# Patient Record
Sex: Female | Born: 1965 | Race: White | Hispanic: Yes | Marital: Married | Smoking: Never smoker
Health system: Southern US, Community
[De-identification: ages and names within clinical notes are randomized; demographics above are authoritative.]

## PROBLEM LIST (undated history)

## (undated) DIAGNOSIS — I10 Essential (primary) hypertension: Secondary | ICD-10-CM

## (undated) DIAGNOSIS — E119 Type 2 diabetes mellitus without complications: Secondary | ICD-10-CM

---

## 2019-10-01 ENCOUNTER — Other Ambulatory Visit: Payer: Self-pay

## 2019-10-01 ENCOUNTER — Encounter (HOSPITAL_COMMUNITY): Payer: Self-pay

## 2019-10-01 ENCOUNTER — Emergency Department (HOSPITAL_COMMUNITY)
Admission: EM | Admit: 2019-10-01 | Discharge: 2019-10-01 | Disposition: A | Discharge status: Home / Self Care | Payer: Self-pay | Attending: Emergency Medicine | Admitting: Emergency Medicine

## 2019-10-01 ENCOUNTER — Emergency Department (HOSPITAL_COMMUNITY): Payer: Self-pay

## 2019-10-01 DIAGNOSIS — Y929 Unspecified place or not applicable: Secondary | ICD-10-CM | POA: Insufficient documentation

## 2019-10-01 DIAGNOSIS — S2242XA Multiple fractures of ribs, left side, initial encounter for closed fracture: Secondary | ICD-10-CM | POA: Insufficient documentation

## 2019-10-01 DIAGNOSIS — I10 Essential (primary) hypertension: Secondary | ICD-10-CM | POA: Insufficient documentation

## 2019-10-01 DIAGNOSIS — W182XXA Fall in (into) shower or empty bathtub, initial encounter: Secondary | ICD-10-CM | POA: Insufficient documentation

## 2019-10-01 DIAGNOSIS — Y93E1 Activity, personal bathing and showering: Secondary | ICD-10-CM | POA: Insufficient documentation

## 2019-10-01 DIAGNOSIS — J45909 Unspecified asthma, uncomplicated: Secondary | ICD-10-CM | POA: Insufficient documentation

## 2019-10-01 DIAGNOSIS — Y999 Unspecified external cause status: Secondary | ICD-10-CM | POA: Insufficient documentation

## 2019-10-01 DIAGNOSIS — W19XXXA Unspecified fall, initial encounter: Secondary | ICD-10-CM

## 2019-10-01 HISTORY — DX: Essential (primary) hypertension: I10

## 2019-10-01 HISTORY — DX: Type 2 diabetes mellitus without complications: E11.9

## 2019-10-01 MED ORDER — HYDROCODONE-ACETAMINOPHEN 5-325 MG PO TABS: 2.0000 | ORAL_TABLET | ORAL | 0 refills | Status: DC | PRN

## 2019-10-01 NOTE — Discharge Instructions (Addendum)
Please use Tylenol or ibuprofen for pain.  You may use 400 mg ibuprofen every 6 hours or 1000 mg of Tylenol every 6 hours.  You may choose to alternate between the 2.  This would be most effective.  Not to exceed 4 g of Tylenol within 24 hours.  Not to exceed 3200 mg ibuprofen 24 hours.   Please use norco only as needed for pain.  This medication can make you drowsy.  Please do not mix with alcohol.

## 2019-10-01 NOTE — ED Triage Notes (Signed)
Pt arrives POV for eval of L sided rib and back pain s/p fall. Pt reports that she slipped and fell this AM while in bathroom, ambulatory but reports severe pain. Pt appears uncomfortable in triage.

## 2019-10-01 NOTE — ED Provider Notes (Signed)
[facility] EMERGENCY DEPARTMENT Provider Note   CSN: [id number] Arrival date & time: 10/01/19  1052     History Chief Complaint  Patient presents with  . Fall    Kathryn Guerra is a 54 y.o. female. A Spanish language interpreter was used for the entirety of this visit including discharge instructions and recommendations for medications.  HPI Patient is a 54 year old female presented today with left-sided rib pain after falling on her shower today and hitting her left side on the side of the bathtub.  States that she had immediate onset of sharp, severe, achy left-sided rib cage pain that is worse with deep breaths.  States she took some naproxen and presented to ED immediately after.  States some improvement with naproxen.   Denies any lightheadedness or dizziness prior to fall states that she fell because she slipped.  Denies any lightheadedness, headache, dizziness after fall.  Denies any head injury.  Denies loss of consciousness.  Denies any nausea or vomiting.  Denies any chest pain or shortness of breath.  States that she is able to take deep breaths although it is uncomfortable.  Denies any other injuries.    Past Medical History:  Diagnosis Date  . Diabetes mellitus without complication (HCC)   . Hypertension     There are no problems to display for this patient.   History reviewed. No pertinent surgical history.   OB History   No obstetric history on file.     No family history on file.  Social History   Tobacco Use  . Smoking status: Never Smoker  . Smokeless tobacco: Never Used  Substance Use Topics  . Alcohol use: Not Currently  . Drug use: Not Currently    Home Medications Prior to Admission medications   Not on File    Allergies    Other  Review of Systems   Review of Systems  Constitutional: Negative for fever.  HENT: Negative for congestion.   Respiratory: Negative for shortness of breath.   Cardiovascular:  Negative for chest pain.  Gastrointestinal: Negative for abdominal distention.  Musculoskeletal:       Rib pain left side  Neurological: Negative for dizziness and headaches.  Hematological: Does not bruise/bleed easily.  Psychiatric/Behavioral: Negative for agitation.    Physical Exam Updated Vital Signs BP 118/72 (BP Location: Left Arm)   Pulse 68   Temp 97.6 F (36.4 C) (Oral)   Resp 18   Ht 4' 11.06" (1.5 m)   Wt 72.6 kg   SpO2 98%   BMI 32.26 kg/m   Physical Exam Vitals and nursing note reviewed.  Constitutional:      General: She is not in acute distress.    Appearance: She is not ill-appearing.  HENT:     Head: Normocephalic and atraumatic.     Nose: Nose normal.  Eyes:     General: No scleral icterus. Neck:     Comments: No midline neck tenderness Cardiovascular:     Rate and Rhythm: Normal rate and regular rhythm.     Pulses: Normal pulses.     Heart sounds: Normal heart sounds.  Pulmonary:     Effort: Pulmonary effort is normal. No respiratory distress.     Breath sounds: No wheezing.  Abdominal:     Palpations: Abdomen is soft.     Tenderness: There is no abdominal tenderness.     Comments: No abdominal TTP  Musculoskeletal:     Cervical back: Normal range of motion.  Right lower leg: No edema.     Left lower leg: No edema.     Comments: Tenderness to palpation over the ninth and 10th rib area of the left side just posterior to mid axillary line  No midline back tenderness F ROM of spine  Skin:    General: Skin is warm and dry.     Capillary Refill: Capillary refill takes less than 2 seconds.     Comments: No bruising of side, no tenting of skin over ribs  Neurological:     Mental Status: She is alert. Mental status is at baseline.     Sensory: No sensory deficit.     Motor: No weakness.     Gait: Gait normal.  Psychiatric:        Mood and Affect: Mood normal.        Behavior: Behavior normal.     ED Results / Procedures / Treatments     Labs (all labs ordered are listed, but only abnormal results are displayed) Labs Reviewed - No data to display  EKG None  Radiology DG Ribs Unilateral W/Chest Left  Result Date: 10/01/2019 CLINICAL DATA:  Fall.Left-sided rib and back pain EXAM: LEFT RIBS AND CHEST - 3+ VIEW COMPARISON:  None FINDINGS: There are age indeterminate fracture deformities involving the posterior aspect of the left ninth and tenth ribs. Normal heart size. No pleural effusion or edema. No airspace opacities. IMPRESSION: 1. Age-indeterminate fracture deformities involving the posterior aspect of the left ninth and tenth ribs. 2. No acute cardiopulmonary abnormalities. Electronically Signed   By: Kerby Moors M.D.   On: 10/01/2019 12:34   DG Lumbar Spine Complete  Result Date: 10/01/2019 CLINICAL DATA:  Pain following fall EXAM: LUMBAR SPINE - COMPLETE 4+ VIEW COMPARISON:  None. FINDINGS: Frontal, lateral, spot lumbosacral lateral, and bilateral oblique views were obtained. There are 5 non-rib-bearing lumbar type vertebral bodies. There is no fracture or spondylolisthesis. There is disc space narrowing at L4-5 and L5-S1. Other disc spaces appear unremarkable. There is facet osteoarthritic change at L5-S1 bilaterally and at L4-5 on the left. 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 No fracture or spondylolisthesis. Electronically Signed   By: Lowella Grip III M.D.   On: 10/01/2019 12:30    Procedures Procedures (including critical care time)  Medications Ordered in ED Medications - No data to display  ED Course  I have reviewed the triage vital signs and the nursing notes.  Pertinent labs & imaging results that were available during my care of the patient were reviewed by me and considered in my medical decision making (see chart for details).    MDM Rules/Calculators/A&P                      Patient presents today after mechanical fall with no head injury or loss of consciousness with  left-sided rib cage pain found to have nondisplaced fractures of ninth and 10th rib.  There is no pneumothorax evident on chest x-ray and on auscultation she has clear lung sounds bilaterally.  She does have some pain with deep inspiration.  Will provide patient with pain medications recommendations in the form of Tylenol ibuprofen and short course of Norco to use as needed.   Long discussion with patient regarding use of incentive spirometer and return precautions if she is having difficulty breathing.  She is understanding of this and I will provide her with Spanish incentive spirometry instructions.  She has no midline back or neck  tenderness no indication for further imaging.  No wrist or elbow pain.  Doubt any other fracture.  Discussed with patient follow-up recommendations with primary care provider for possible bone density scan/DEXA scan determined reasonable by PCP.  She is understanding of plan and comfortable discharge at this time.  Vitals within normal limits.      The medical records were personally reviewed by myself. I personally reviewed all lab results and interpreted all imaging studies and either concurred with their official read or contacted radiology for clarification.   This patient appears reasonably screened and I doubt any other medical condition requiring further workup, evaluation, or treatment in the ED at this time prior to discharge.   Patient's vitals are WNL apart from vital sign abnormalities discussed above, patient is in NAD, and able to ambulate in the ED at their baseline and able to tolerate PO.  Pain has been managed or a plan has been made for home management and has no complaints prior to discharge. Patient is comfortable with above plan and for discharge at this time. All questions were answered prior to disposition. Results from the ER workup discussed with the patient face to face and all questions answered to the best of my ability. The patient is  safe for discharge with strict return precautions. Patient appears safe for discharge with appropriate follow-up. Conveyed my impression with the patient and they voiced understanding and are agreeable to plan.   An After Visit Summary was printed and given to the patient.  Portions of this note were generated with Scientist, clinical (histocompatibility and immunogenetics). Dictation errors may occur despite best attempts at proofreading.    Final Clinical Impression(s) / ED Diagnoses Final diagnoses:  Closed fracture of multiple ribs of left side, initial encounter    Rx / DC Orders ED Discharge Orders    None       Gailen Shelter, [place] 10/01/19 1416    Niel Hummer, MD 10/02/19 [phone]

## 2020-12-28 ENCOUNTER — Other Ambulatory Visit: Payer: Self-pay

## 2020-12-28 ENCOUNTER — Emergency Department (HOSPITAL_COMMUNITY)
Admission: EM | Admit: 2020-12-28 | Discharge: 2020-12-29 | Disposition: A | Discharge status: Home / Self Care | Payer: Self-pay | Attending: Emergency Medicine | Admitting: Emergency Medicine

## 2020-12-28 ENCOUNTER — Encounter (HOSPITAL_COMMUNITY): Payer: Self-pay

## 2020-12-28 ENCOUNTER — Emergency Department (HOSPITAL_COMMUNITY): Payer: Self-pay

## 2020-12-28 DIAGNOSIS — I10 Essential (primary) hypertension: Secondary | ICD-10-CM | POA: Insufficient documentation

## 2020-12-28 DIAGNOSIS — M542 Cervicalgia: Secondary | ICD-10-CM | POA: Insufficient documentation

## 2020-12-28 DIAGNOSIS — G8929 Other chronic pain: Secondary | ICD-10-CM | POA: Insufficient documentation

## 2020-12-28 DIAGNOSIS — M436 Torticollis: Secondary | ICD-10-CM | POA: Insufficient documentation

## 2020-12-28 DIAGNOSIS — Z7984 Long term (current) use of oral hypoglycemic drugs: Secondary | ICD-10-CM | POA: Insufficient documentation

## 2020-12-28 DIAGNOSIS — R0789 Other chest pain: Secondary | ICD-10-CM | POA: Insufficient documentation

## 2020-12-28 DIAGNOSIS — R42 Dizziness and giddiness: Secondary | ICD-10-CM | POA: Insufficient documentation

## 2020-12-28 DIAGNOSIS — E119 Type 2 diabetes mellitus without complications: Secondary | ICD-10-CM | POA: Insufficient documentation

## 2020-12-28 LAB — BASIC METABOLIC PANEL
Anion gap: 6 (ref 5–15)
BUN: 12 mg/dL (ref 6–20)
CO2: 27 mmol/L (ref 22–32)
Calcium: 9.2 mg/dL (ref 8.9–10.3)
Chloride: 102 mmol/L (ref 98–111)
Creatinine, Ser: 0.48 mg/dL (ref 0.44–1.00)
GFR, Estimated: >60 mL/min (ref >60)
Glucose, Bld: 175 mg/dL — ABNORMAL HIGH (ref 70–99)
Potassium: 3.4 mmol/L — ABNORMAL LOW (ref 3.5–5.1)
Sodium: 135 mmol/L (ref 135–145)

## 2020-12-28 LAB — CBC
HCT: 39.7 % (ref 36.0–46.0)
Hemoglobin: 13.3 g/dL (ref 12.0–15.0)
MCH: 28.4 pg (ref 26.0–34.0)
MCHC: 33.5 g/dL (ref 30.0–36.0)
MCV: 84.8 fL (ref 80.0–100.0)
Platelets: 274 10*3/uL (ref 150–400)
RBC: 4.68 MIL/uL (ref 3.87–5.11)
RDW: 12.8 % (ref 11.5–15.5)
WBC: 7.2 10*3/uL (ref 4.0–10.5)
nRBC: 0 % (ref 0.0–0.2)

## 2020-12-28 LAB — TROPONIN I (HIGH SENSITIVITY): Troponin I (High Sensitivity): 3 ng/L (ref <18)

## 2020-12-28 LAB — I-STAT BETA HCG BLOOD, ED (MC, WL, AP ONLY): I-stat hCG, quantitative: <5 m[IU]/mL (ref <5)

## 2020-12-28 NOTE — ED Triage Notes (Signed)
Patient reports R sided neck pain and dizziness, states it has been going on all day, also with chest pain radiating into her back

## 2020-12-29 LAB — TROPONIN I (HIGH SENSITIVITY): Troponin I (High Sensitivity): 3 ng/L (ref <18)

## 2020-12-29 MED ORDER — NAPROXEN 500 MG PO TABS: 500.0000 mg | ORAL_TABLET | Freq: Two times a day (BID) | ORAL | 0 refills | Status: DC

## 2020-12-29 MED ORDER — CYCLOBENZAPRINE HCL 10 MG PO TABS: 10.0000 mg | ORAL_TABLET | Freq: Two times a day (BID) | ORAL | 0 refills | Status: DC | PRN

## 2020-12-29 MED ORDER — DICLOFENAC SODIUM 1 % EX GEL: 2.0000 g | Freq: Four times a day (QID) | CUTANEOUS | 0 refills | Status: DC

## 2020-12-29 NOTE — ED Provider Notes (Signed)
[facility] EMERGENCY DEPARTMENT Provider Note   CSN: [id number] Arrival date & time: 12/28/20  2132     History Chief Complaint  Patient presents with  . Neck Pain  . Dizziness    Kathryn Guerra is a 55 y.o. female with past medical history of DM and HTN who presents the ED with complaints of right-sided neck pain and dizziness.  On my examination, patient is accompanied by her daughter was at bedside.  Evidently she has been experiencing this neck pain for over 3 months.  She states that she comes to the ER, but never sees the same provider.  She understands that she needs continuity of care, but has not been able to get established on an outpatient basis which she attributes to her language barriers.    She tells that at 7 PM last evening while at church she developed severe right-sided neck discomfort described as a muscle spasm, "pulling" her head in a certain direction.  She states that this discomfort that radiated down into her chest and back and caused her to feel lightheaded.  This lasted approximately 1 hour before resolving spontaneously.  She states that she has been having similar issues with her neck for several months.  She is actively grabbing her neck during my examination.  She also states that she started "shaking" while grabbing her neck which she thinks may have been due to anxiety from her discomfort.  Denies history of seizure disorder.  No tongue biting or incontinence.  She and the daughter repeatedly asked about whether or not she is still able to inquire about work and inquires about possible medical condition that could warrant disability.   She denies any fevers or chills, current chest pain or shortness of breath, cough, rash, abdominal pain, nausea or vomiting, or other symptoms.  Translator was used for entirety of history, physical exam, assessment, and plan.  HPI     Past Medical History:  Diagnosis Date  . Diabetes mellitus  without complication (HCC)   . Hypertension     There are no problems to display for this patient.   History reviewed. No pertinent surgical history.   OB History   No obstetric history on file.     History reviewed. No pertinent family history.  Social History   Tobacco Use  . Smoking status: Never Smoker  . Smokeless tobacco: Never Used  Substance Use Topics  . Alcohol use: Not Currently  . Drug use: Not Currently    Home Medications Prior to Admission medications   Medication Sig Start Date End Date Taking? Authorizing Provider  cyclobenzaprine (FLEXERIL) 10 MG tablet Take 1 tablet (10 mg total) by mouth 2 (two) times daily as needed for muscle spasms. 12/29/20  Yes Lorelee New, PA-C  diclofenac Sodium (VOLTAREN) 1 % GEL Apply 2 g topically 4 (four) times daily. 12/29/20  Yes Lorelee New, PA-C  ketoconazole (NIZORAL) 2 % cream Apply 1 application topically daily. To feet 12/21/20  Yes [provider]  metFORMIN (GLUCOPHAGE) 1000 MG tablet Take 1,000 mg by mouth 2 (two) times daily. 12/21/20  Yes [provider]  naproxen (NAPROSYN) 500 MG tablet Take 1 tablet (500 mg total) by mouth 2 (two) times daily. 12/29/20  Yes Lorelee New, PA-C  pioglitazone (ACTOS) 15 MG tablet Take 15 mg by mouth daily. 12/21/20  Yes [provider]  HYDROcodone-acetaminophen (NORCO/VICODIN) 5-325 MG tablet Take 2 tablets by mouth every 4 (four) hours as needed.  Patient not taking: Reported on 12/29/2020 10/01/19   Gailen Shelter, PA    Allergies    Other  Review of Systems   Review of Systems  All other systems reviewed and are negative.   Physical Exam Updated Vital Signs BP 114/63   Pulse 64   Temp (!) 97.4 F (36.3 C) (Oral)   Resp 15   SpO2 100%   Physical Exam Vitals and nursing note reviewed. Exam conducted with a chaperone present.  Constitutional:      General: She is not in acute distress.    Appearance: She is not ill-appearing or  toxic-appearing.  HENT:     Head: Normocephalic and atraumatic.  Eyes:     General: No scleral icterus.    Conjunctiva/sclera: Conjunctivae normal.  Neck:     Comments: ROM intact, albeit limited due to discomfort.  Turns her head in all directions.  No overlying skin changes or masses noted.  No midline cervical tenderness.  Tenderness over trapezius and paraspinous muscles bilaterally.  No meningismus. Cardiovascular:     Rate and Rhythm: Normal rate.     Pulses: Normal pulses.  Pulmonary:     Effort: Pulmonary effort is normal. No respiratory distress.  Abdominal:     General: Abdomen is flat. There is no distension.     Palpations: Abdomen is soft.     Tenderness: There is no abdominal tenderness.  Musculoskeletal:        General: Normal range of motion.     Cervical back: Normal range of motion. Tenderness present.  Skin:    General: Skin is dry.  Neurological:     General: No focal deficit present.     Mental Status: She is alert and oriented to person, place, and time.     GCS: GCS eye subscore is 4. GCS verbal subscore is 5. GCS motor subscore is 6.  Psychiatric:        Mood and Affect: Mood normal.        Behavior: Behavior normal.        Thought Content: Thought content normal.     ED Results / Procedures / Treatments   Labs (all labs ordered are listed, but only abnormal results are displayed) Labs Reviewed  BASIC METABOLIC PANEL - Abnormal; Notable for the following components:      Result Value   Potassium 3.4 (*)    Glucose, Bld 175 (*)    All other components within normal limits  CBC  I-STAT BETA HCG BLOOD, ED ([facility], [facility], [facility] ONLY)  TROPONIN I (HIGH SENSITIVITY)  TROPONIN I (HIGH SENSITIVITY)    EKG None  Radiology DG Chest 2 View  Result Date: 12/28/2020 CLINICAL DATA:  Chest pain EXAM: CHEST - 2 VIEW COMPARISON:  Radiograph 10/01/2019 FINDINGS: Slightly low lung volumes with some streaky opacities in the bases favoring atelectatic change. No focal  consolidation, pneumothorax or effusion. The cardiomediastinal contours are unremarkable. No other acute osseous or soft tissue abnormality. Cholecystectomy clips in the right upper quadrant. IMPRESSION: Slightly low lung volumes with streaky opacities in the bases favoring atelectatic change. No other acute cardiopulmonary abnormality. Electronically Signed   By: Kreg Shropshire M.D.   On: 12/28/2020 22:30    Procedures Procedures   Medications Ordered in ED Medications - No data to display  ED Course  I have reviewed the triage vital signs and the nursing notes.  Pertinent labs & imaging results that were available during my care of the patient were reviewed  by me and considered in my medical decision making (see chart for details).    MDM Rules/Calculators/A&P                          Kathryn Guerra was evaluated in Emergency Department on 12/29/2020 for the symptoms described in the history of present illness. She was evaluated in the context of the global COVID-19 pandemic, which necessitated consideration that the patient might be at risk for infection with the SARS-CoV-2 virus that causes COVID-19. Institutional protocols and algorithms that pertain to the evaluation of patients at risk for COVID-19 are in a state of rapid change based on information released by regulatory bodies including the CDC and federal and state organizations. These policies and algorithms were followed during the patient's care in the ED.  I personally reviewed patient's medical chart and all notes from triage and staff during today's encounter. I have also ordered and reviewed all labs and imaging that I felt to be medically necessary in the evaluation of this patient's complaints and with consideration of their physical exam. If needed, translation services were available and utilized.   Patient describes an episode of severe neck "pulling" spasm that sounds comparable to torticollis.  She is not  demonstrating evidence of torticollis on my current exam and is able to move her neck.  There is no meningismus and she states that her symptoms have been chronic for several months.  I have low suspicion for meningitis or an acute or emergent pathology.  She is not ill-appearing and her vital signs are all stable and within normal limits.  She has been here for over 9 hours without any decompensation. She states her symptoms are chronic.  Pulm suspects that her chest discomfort and lightheadedness was anxiety related to her neck spasm, she was also reassured by her work-up which revealed lack of any acute cardiopulmonary disease.  Troponin trended x2 and stable at 3.  No derangement in labs.  She also reports that usually she is hyperglycemic when she has some chest discomfort and lightheadedness, but is reassured by glucose that is unremarkable at 175 here in the ER.  Patient is presenting after chest discomfort, secondary to neck pain.  Comprehensive work-up obtained to assess for cause of symptoms.  EKG without evidence of STEMI.  Troponin is negative, lowering concern for NSTEMI.  Patient has a low Heart Score which lowers suspicion for ACS.  I have low suspicion for dissection given normal pulses in extremities and normal mediastinum on CXR.  I reviewed DG Chest and there is no evidence of pneumothorax or consolidation concerning for pneumonia.  There is also no pleural effusion on x-ray or history suggestive of possible esophageal rupture.  Patient is PERC negative.  I discussed the patient the exact etiology of their chest pain is unclear and warrants follow up with a primary care provider. Also discussed that while their risk for ACS is low, it is not completely eliminated and I discussed with patient specific warning signs and return precautions.  Emphasized the importance of close outpatient follow-up and continued evaluation and management.  If her symptoms do not improve with conservative  management, she may ultimately require referral to neurosurgery, rheumatology, physical therapy for ongoing evaluation and management.  In the interim, will treat with muscle relaxants, NSAIDs, topical Voltaren gel, heating pads, and light stretching exercises.  I spoke with Oletta Cohnamellia Wood RN LCSW who was able to schedule an appointment with primary  care provider for the patient.  ED return precautions discussed.  Patient voices understanding and is agreeable to the plan.  Final Clinical Impression(s) / ED Diagnoses Final diagnoses:  Torticollis  Chronic neck pain    Rx / DC Orders ED Discharge Orders         Ordered    cyclobenzaprine (FLEXERIL) 10 MG tablet  2 times daily PRN        12/29/20 0737    naproxen (NAPROSYN) 500 MG tablet  2 times daily        12/29/20 0737    diclofenac Sodium (VOLTAREN) 1 % GEL  4 times daily        12/29/20 0737           Lorelee New, PA-C 12/29/20 1601    Cheryll Cockayne, MD 12/29/20 1341

## 2020-12-29 NOTE — Discharge Instructions (Addendum)
Please read the attachment on torticollis.  Given your 4-month history of neck spasms and discomfort, suspect that you would benefit from muscle relaxants, NSAIDs, heating pads, and light stretching exercises.  You were given a prescription for Flexeril which is a muscle relaxer.  You should not drive, work, consume alcohol, or operate machinery while taking this medication as it can make you very drowsy.     I cannot emphasize enough the importance of getting established with primary care provider for continued outpatient evaluation and management.  You may ultimately benefit from physical therapy or referral to specialist for ongoing care.  Return to the ED or seek immediate medical attention should you experience any new or worsening symptoms.  Lea el archivo adjunto sobre la tortcolis. Dado su historial de 3 meses de espasmos y [employer] cuello, sospeche que se beneficiara de los [address] musculares, los [place], las almohadillas trmicas y los ejercicios ligeros de estiramiento.  Le recetaron Flexeril, que es un relajante muscular. No debe conducir, trabajar, consumir alcohol ni operar maquinaria mientras toma [place], ya que puede causarle mucho sueo.   No puedo enfatizar lo suficiente la importancia de establecerse con el proveedor de atencin primaria para la evaluacin y el manejo ambulatorios continuos.  En ltima instancia, puede beneficiarse de la fisioterapia o la derivacin a un especialista para recibir atencin continua.  Regrese al servicio de urgencias o busque atencin mdica inmediata si experimenta sntomas nuevos o que empeoran.

## 2020-12-29 NOTE — Discharge Planning (Signed)
Kathryn Guerra J. Lucretia Roers, RN, BSN, [place] [phone]  RNCM set up appointment with [facility] on 5/10 @2 :00.  Spoke with pt at bedside and advised to please arrive 15 min early and take a picture ID and your current medications.  Pt verbalizes understanding of keeping appointment.

## 2021-01-23 ENCOUNTER — Other Ambulatory Visit: Payer: Self-pay

## 2021-01-23 ENCOUNTER — Other Ambulatory Visit (INDEPENDENT_AMBULATORY_CARE_PROVIDER_SITE_OTHER): Payer: Self-pay | Admitting: Primary Care

## 2021-01-23 ENCOUNTER — Other Ambulatory Visit: Payer: Self-pay | Admitting: Pharmacy Technician

## 2021-01-23 ENCOUNTER — Ambulatory Visit (INDEPENDENT_AMBULATORY_CARE_PROVIDER_SITE_OTHER): Payer: Self-pay | Admitting: Primary Care

## 2021-01-23 ENCOUNTER — Encounter (INDEPENDENT_AMBULATORY_CARE_PROVIDER_SITE_OTHER): Payer: Self-pay | Admitting: Primary Care

## 2021-01-23 VITALS — BP 119/68 | HR 71 | Temp 97.3°F | Ht 59.0 in | Wt 141.8 lb

## 2021-01-23 DIAGNOSIS — E119 Type 2 diabetes mellitus without complications: Secondary | ICD-10-CM

## 2021-01-23 DIAGNOSIS — Z1231 Encounter for screening mammogram for malignant neoplasm of breast: Secondary | ICD-10-CM

## 2021-01-23 DIAGNOSIS — G8929 Other chronic pain: Secondary | ICD-10-CM

## 2021-01-23 DIAGNOSIS — Z09 Encounter for follow-up examination after completed treatment for conditions other than malignant neoplasm: Secondary | ICD-10-CM

## 2021-01-23 DIAGNOSIS — M542 Cervicalgia: Secondary | ICD-10-CM

## 2021-01-23 DIAGNOSIS — Z124 Encounter for screening for malignant neoplasm of cervix: Secondary | ICD-10-CM

## 2021-01-23 DIAGNOSIS — R809 Proteinuria, unspecified: Secondary | ICD-10-CM

## 2021-01-23 DIAGNOSIS — E876 Hypokalemia: Secondary | ICD-10-CM

## 2021-01-23 DIAGNOSIS — Z7689 Persons encountering health services in other specified circumstances: Secondary | ICD-10-CM

## 2021-01-23 LAB — POCT GLYCOSYLATED HEMOGLOBIN (HGB A1C): Hemoglobin A1C: 9.8 % — AB (ref 4.0–5.6)

## 2021-01-23 MED ORDER — PIOGLITAZONE HCL 15 MG PO TABS
15.0000 mg | ORAL_TABLET | Freq: Every day | ORAL | 3 refills | Status: AC
  Filled 2021-01-23: qty 30, 30d supply, fill #0

## 2021-01-23 MED ORDER — LISINOPRIL 2.5 MG PO TABS: 2.5000 mg | ORAL_TABLET | Freq: Every day | ORAL | 1 refills | Status: DC

## 2021-01-23 MED ORDER — METFORMIN HCL 1000 MG PO TABS
1000.0000 mg | ORAL_TABLET | Freq: Two times a day (BID) | ORAL | 6 refills | Status: AC
  Filled 2021-01-23: qty 60, 30d supply, fill #0

## 2021-01-23 MED ORDER — POTASSIUM CHLORIDE CRYS ER 20 MEQ PO TBCR
20.0000 meq | EXTENDED_RELEASE_TABLET | Freq: Every day | ORAL | 0 refills | Status: DC
  Filled 2021-01-23: qty 30, 30d supply, fill #0

## 2021-01-23 MED ORDER — BLOOD GLUCOSE METER KIT: PACK | 0 refills | Status: DC

## 2021-01-23 MED ORDER — GLIPIZIDE 10 MG PO TABS
10.0000 mg | ORAL_TABLET | Freq: Two times a day (BID) | ORAL | 3 refills | Status: AC
  Filled 2021-01-23: qty 60, 30d supply, fill #0

## 2021-01-23 MED ORDER — PIOGLITAZONE HCL 15 MG PO TABS: 15.0000 mg | ORAL_TABLET | Freq: Every day | ORAL | 3 refills | Status: DC

## 2021-01-23 MED ORDER — POTASSIUM CHLORIDE CRYS ER 20 MEQ PO TBCR: 20.0000 meq | EXTENDED_RELEASE_TABLET | Freq: Every day | ORAL | 0 refills | Status: DC

## 2021-01-23 MED ORDER — CYCLOBENZAPRINE HCL 10 MG PO TABS: 10.0000 mg | ORAL_TABLET | Freq: Two times a day (BID) | ORAL | 1 refills | Status: DC | PRN

## 2021-01-23 MED ORDER — LISINOPRIL 2.5 MG PO TABS
2.5000 mg | ORAL_TABLET | Freq: Every day | ORAL | 1 refills | Status: AC
  Filled 2021-01-23: qty 30, 30d supply, fill #0

## 2021-01-23 MED ORDER — METFORMIN HCL 1000 MG PO TABS: 1000.0000 mg | ORAL_TABLET | Freq: Two times a day (BID) | ORAL | 6 refills | Status: DC

## 2021-01-23 MED ORDER — NAPROXEN 500 MG PO TABS: 500.0000 mg | ORAL_TABLET | Freq: Two times a day (BID) | ORAL | 1 refills | Status: DC

## 2021-01-23 MED ORDER — BLOOD GLUCOSE MONITOR SYSTEM W/DEVICE KIT
PACK | 0 refills | Status: AC
  Filled 2021-01-23: qty 1, 365d supply, fill #0

## 2021-01-23 MED ORDER — GLIPIZIDE 10 MG PO TABS: 10.0000 mg | ORAL_TABLET | Freq: Two times a day (BID) | ORAL | 3 refills | Status: DC

## 2021-01-23 NOTE — Progress Notes (Signed)
[facility]   Subjective:    [name] is a 55 y.o. Hispanic  female (interputor Kathryn Guerra &[id number])presents for hospital follow up and establish care. Admit date to the hospital was 12/28/20, patient was discharged from the hospital on 12/29/20, patient was admitted for: Torticollis .Chronic neck pain. Has not had medical care . Neck pain only occurs with stress.     Past Medical History:  Diagnosis Date  . Diabetes mellitus without complication (HCC)   . Hypertension      Allergies  Allergen Reactions  . Other     Pt reports anaphylactic reaction to "shot for pain"      Current Outpatient Medications on File Prior to Visit  Medication Sig Dispense Refill  . cyclobenzaprine (FLEXERIL) 10 MG tablet Take 1 tablet (10 mg total) by mouth 2 (two) times daily as needed for muscle spasms. 20 tablet 0  . diclofenac Sodium (VOLTAREN) 1 % GEL Apply 2 g topically 4 (four) times daily. 100 g 0  . HYDROcodone-acetaminophen (NORCO/VICODIN) 5-325 MG tablet Take 2 tablets by mouth every 4 (four) hours as needed. (Patient not taking: Reported on 12/29/2020) 6 tablet 0  . ketoconazole (NIZORAL) 2 % cream Apply 1 application topically daily. To feet    . metFORMIN (GLUCOPHAGE) 1000 MG tablet Take 1,000 mg by mouth 2 (two) times daily.    . naproxen (NAPROSYN) 500 MG tablet Take 1 tablet (500 mg total) by mouth 2 (two) times daily. 30 tablet 0  . pioglitazone (ACTOS) 15 MG tablet Take 15 mg by mouth daily.     No current facility-administered medications on file prior to visit.     Review of System: Review of Systems  Musculoskeletal: Positive for neck pain.  Psychiatric/Behavioral: The patient is nervous/anxious.   All other systems reviewed and are negative.   Objective:  BP 119/68 (BP Location: Right Arm, Patient Position: Sitting, Cuff Size: Normal)   Pulse 71   Temp (!) 97.3 F (36.3 C) (Temporal)   Ht 4\' 11"  (1.499 m)   Wt 141 lb 12.8 oz (64.3 kg)   SpO2  100%   BMI 28.64 kg/m   Physical Exam: General Appearance: Well nourished, female in no apparent distress. Eyes: PERRLA, EOMs, conjunctiva no swelling or erythema Sinuses: No Frontal/maxillary tenderness ENT/Mouth: Ext aud canals clear, TMs without erythema, bulging.  Neck: Supple, thyroid normal.  Respiratory: Respiratory effort normal, BS equal bilaterally without rales, rhonchi, wheezing or stridor.  Cardio: RRR with no MRGs. Brisk peripheral pulses without edema.  Abdomen: Soft, + BS.  Non tender, no guarding, rebound, hernias, masses. Lymphatics: Non tender without lymphadenopathy.  Musculoskeletal: Full ROM, 5/5 strength, normal gait.  Skin: Warm, dry without rashes, lesions, ecchymosis.  Neuro:  Normal muscle ton Psych: Awake and oriented X 3, normal affect, Insight and Judgment appropriate.    Assessment:  Kathryn Guerra was seen today for hospitalization follow-up.  Diagnoses and all orders for this visit:  Hospital discharge follow-up Establish care with PCP (K+ 3.4)   Establishing care with new doctor, encounter for Establish care with PCP  Chronic neck pain -     naproxen (NAPROSYN) 500 MG tablet; Take 1 tablet (500 mg total) by mouth 2 (two) times daily. -     cyclobenzaprine (FLEXERIL) 10 MG tablet; Take 1 tablet (10 mg total) by mouth 2 (two) times daily as needed for muscle spasms.  Type 2 diabetes mellitus without complication, without long-term current use of insulin (HCC) -  metFORMIN (GLUCOPHAGE) 1000 MG tablet; Take 1 tablet (1,000 mg total) by mouth 2 (two) times daily. -     HgB A1c 9.8 Goal of therapy: Less than 6.5 hemoglobin A1c. Discussed  co- morbidities with uncontrol diabetes  Complications -diabetic retinopathy, (close your eyes ? What do you see nothing) nephropathy decrease in kidney function- can lead to dialysis-on a machine 3 days a week to filter your kidney, neuropathy- numbness and tinging in your hands and feet,  increase risk of heart attack  and stroke, and amputation due to decrease wound healing and circulation. Decrease your risk by taking medication daily as prescribed, monitor carbohydrates- foods that are high in carbohydrates are the following rice, potatoes, breads, sugars, and pastas.  Reduction in the intake (eating) will assist in lowering your blood sugars. Exercise daily at least 30 minutes daily. Added Lisinopril 2.5mg  for renal protection  Lipid screening    Encounter for screening mammogram for malignant neoplasm of breast Patient completed application for BCCP while in clinic and application has and faxed to [facility]. Patient aware that [facility] will contact her directly to schedule appointment.  Cervical cancer screening Patient completed application for BCCP while in clinic and application has and faxed to [facility]. Patient aware that [facility] will contact her directly to schedule appointment.   This note has been created with Education officer, environmental. Any transcriptional errors are unintentional.   Grayce Sessions, NP 01/23/2021, 2:12 PM

## 2021-01-23 NOTE — Patient Instructions (Signed)
Plano de ao para o diabetes mellitus Diabetes Mellitus Action Plan Seguir um plano de ao para o diabetes  uma maneira de controlar seus sintomas de diabetes (diabetes mellitus). O plano  codificado por cores para ajud-lo a entender quais medidas voc precisa tomar com base nos eventuais sintomas que esteja tendo.  Se voc tiver sintomas da zona vermelha, busque atendimento mdico imediatamente.  Se voc tiver sintomas da zona amarela, voc est tendo problemas.  Se voc tiver sintomas da zona verde, voc est bem. Aprender e entender sobre o diabetes pode levar tempo. Siga o plano que voc traar junto com seu mdico. Saiba qual  a faixa alvo para seu nvel de acar no sangue (glicose) e revise seu plano de tratamento com seu mdico a cada consulta. A faixa alvo para o meu nvel de acar no sangue  __________________________ mg/dl. Zona vermelha Busque atendimento mdico imediatamente se voc apresentar qualquer [employer] seguintes sintomas:  Um resultado de teste de acar no sangue de 54 mg/dl (3 mmol/l).  Um resultado de teste de acar no sangue igual ou maior que 240 mg/dl (97,6 mmol/l) por 2 dias consecutivos.  Confuso ou dificuldade para pensar claramente.  Dificuldade para respirar.  Doena ou febre por 2 dias ou mais e que no est melhorando.  Nveis moderados ou elevados de cetonas na urina.  Sensao de cansao ou falta de energia. Se voc tiver algum sintoma da zona vermelha, no espere para ver se os sintomas desaparecem. Procure um mdico imediatamente. Ligue para o nmero de Technical brewer (911, nos EUA). No dirija por conta prpria at o hospital. Caso seu acar no sangue caia demais (hipoglicemia grave) e voc no consiga comer ou beber, voc poder precisar de glucagon. Garanta que um parente ou amigo prximo aprenda como verificar seu acar no sangue e como lhe aplicar glucagon. Voc pode precisar ser tratado em um hospital para esse quadro clnico.    Zona amarela Se voc tiver qualquer [employer] sintomas a seguir, seu diabetes no est sob controle, e voc pode precisar fazer algumas alteraes:  Um resultado de teste de acar no sangue igual ou maior que 240 mg/dl (73,4 mmol/l) por 2 dias consecutivos.  Resultado de teste de acar no sangue abaixo de 70 mg/dl (3,9 mmol/l).  Outros sintomas de hipoglicemia, como: ? Special educational needs teacher. ? Confuso ou irritabilidade. ? Sentir fome. ? Estar com batimentos cardacos acelerados. Se voc tiver algum sintoma da zona amarela:  Trate sua hipoglicemia comendo ou bebendo 15 gramas de um carboidrato de ao rpida. Siga a regra 15:15: ? Consuma 15 g de um carboidrato de ao rpida, como:  1 tubo de glicose em gel.  4 plulas de glicose.  4 onas (120 ml) de suco de fruta.  4 onas (120 ml) de refrigerante normal (no diet). ? Verifique seu nvel de acar no sangue 15 minutos aps consumir o carboidrato. ? Caso o segundo teste de acar no sangue ainda esteja em 70 mg/dl (3,9 mmol/l) ou abaixo disso, ingira mais 15 gramas de um carboidrato. ? Se seu acar no sangue no ultrapassar 70 mg/dl (3,9 mmol/L) aps 3 tentativas, busque atendimento mdico imediatamente. ? Faa uma refeio ou um lanche at 1 hora depois de seu acar no sangue voltar ao normal.  Continue tomando seus medicamentos de rotina conforme orientado pelo seu mdico.  Verifique seu acar no sangue com maior frequncia do que o normal. ? Anote os resultados. ? Ligue para seu mdico caso tenha dificuldade para Armed forces training and education officer  seu acar no sangue dentro da meta.   Zona verde Esses sinais significam que voc est indo bem e pode manter o que est fazendo atualmente para Chief Operating Officer o diabetes:  Seu acar no sangue est dentro da sua faixa alvo pessoal. Para a [employer], o nvel de acar no sangue antes de uma refeio (pr-prandial) deve ser de 80-130 mg/dl (3,7-8,5 mmol/l).  Sentir-se bem e conseguir Education officer, environmental  as atividades do dia a dia. Caso esteja na zona verde, continue a controlar o diabetes de acordo com as orientaes do seu mdico. Para fazer isso:  Mantenha uma dieta saudvel.  Faa exerccios regularmente.  Monitore seu nvel de acar no sangue de acordo com as orientaes do seu mdico.  Tome seus medicamentos somente de acordo com as orientaes do seu mdico.   Onde conseguir mais informaes  American Diabetes Association, ADA (Associao de Diabetes Americana): diabetes.org  Association of Diabetes Care & Education Specialists, ADCES (Associao de Especialistas em Tratamento e Educao do Diabetes): diabeteseducator.org Resumo  Seguir um plano de ao para o diabetes  uma maneira de controlar seus sintomas de diabetes. O plano  codificado por cores para ajud-lo a entender quais medidas voc precisa tomar com base nos eventuais sintomas que esteja tendo.  Siga o plano que voc traar junto com seu mdico. Certifique-se de saber qual sua faixa alvo pessoal de acar no sangue.  Revise seu plano de tratamento junto com seu mdico a cada consulta. Estas informaes no se destinam a substituir as recomendaes de seu mdico. No deixe de discutir quaisquer dvidas com seu mdico. Document Revised: 04/03/2020 Document Reviewed: 04/03/2020 Elsevier Patient Education  2021 [employer].

## 2021-01-23 NOTE — Progress Notes (Signed)
Neck pain when she gets very nervous or tired  Has had 2-3 days without pain

## 2021-01-24 ENCOUNTER — Telehealth (INDEPENDENT_AMBULATORY_CARE_PROVIDER_SITE_OTHER): Payer: Self-pay

## 2021-01-24 ENCOUNTER — Other Ambulatory Visit: Payer: Self-pay

## 2021-01-24 ENCOUNTER — Other Ambulatory Visit (INDEPENDENT_AMBULATORY_CARE_PROVIDER_SITE_OTHER): Payer: Self-pay | Admitting: Primary Care

## 2021-01-24 DIAGNOSIS — E782 Mixed hyperlipidemia: Secondary | ICD-10-CM

## 2021-01-24 LAB — LIPID PANEL
Chol/HDL Ratio: 4.3 ratio (ref 0.0–4.4)
Cholesterol, Total: 185 mg/dL (ref 100–199)
HDL: 43 mg/dL (ref >39)
LDL Chol Calc (NIH): 115 mg/dL — ABNORMAL HIGH (ref 0–99)
Triglycerides: 150 mg/dL — ABNORMAL HIGH (ref 0–149)
VLDL Cholesterol Cal: 27 mg/dL (ref 5–40)

## 2021-01-24 LAB — CMP14+EGFR
ALT: 17 IU/L (ref 0–32)
AST: 21 IU/L (ref 0–40)
Albumin/Globulin Ratio: 1.5 (ref 1.2–2.2)
Albumin: 4.5 g/dL (ref 3.8–4.9)
Alkaline Phosphatase: 109 IU/L (ref 44–121)
BUN/Creatinine Ratio: 30 — ABNORMAL HIGH (ref 9–23)
BUN: 14 mg/dL (ref 6–24)
Bilirubin Total: 0.3 mg/dL (ref 0.0–1.2)
CO2: 25 mmol/L (ref 20–29)
Calcium: 9.7 mg/dL (ref 8.7–10.2)
Chloride: 102 mmol/L (ref 96–106)
Creatinine, Ser: 0.47 mg/dL — ABNORMAL LOW (ref 0.57–1.00)
Globulin, Total: 3 g/dL (ref 1.5–4.5)
Glucose: 111 mg/dL — ABNORMAL HIGH (ref 65–99)
Potassium: 4.2 mmol/L (ref 3.5–5.2)
Sodium: 141 mmol/L (ref 134–144)
Total Protein: 7.5 g/dL (ref 6.0–8.5)
eGFR: 112 mL/min/{1.73_m2} (ref >59)

## 2021-01-24 LAB — MICROALBUMIN, URINE: Microalbumin, Urine: 3.4 ug/mL

## 2021-01-24 MED ORDER — PRAVASTATIN SODIUM 20 MG PO TABS
20.0000 mg | ORAL_TABLET | Freq: Every day | ORAL | 3 refills | Status: AC
  Filled 2021-01-24: qty 30, 30d supply, fill #0

## 2021-01-24 NOTE — Telephone Encounter (Signed)
Called patient using pacific interpreter [phone]) patient is aware that results are normal except elevated cholesterol which can lead to stroke or heart attack. Advised patient to decrease fatty food, red meat, cheese and milk. Increased fiber with whole grains and veggies. Cholesterol medication sent to pharmacy to help lower cholesterol; take at bedtime. She verbalized understanding. Maryjean Morn, CMA

## 2021-01-24 NOTE — Telephone Encounter (Signed)
-----   Message from Grayce Sessions, NP sent at 01/24/2021  8:44 AM EDT ----- Elevated cholesterol this can lead to heart attack and stroke. To lower your number you can decrease your fatty foods, red meat, cheese, milk and increase fiber like whole grains and veggies. You can also add a fiber supplement like Metamucil or Benefiber. Sent in pravastatin 20mg  at bedtime

## 2021-01-31 ENCOUNTER — Other Ambulatory Visit: Payer: Self-pay

## 2021-04-25 ENCOUNTER — Ambulatory Visit (INDEPENDENT_AMBULATORY_CARE_PROVIDER_SITE_OTHER): Payer: Self-pay | Admitting: Nurse Practitioner

## 2021-04-25 ENCOUNTER — Other Ambulatory Visit: Payer: Self-pay

## 2021-04-25 ENCOUNTER — Encounter (INDEPENDENT_AMBULATORY_CARE_PROVIDER_SITE_OTHER): Payer: Self-pay | Admitting: Nurse Practitioner

## 2021-04-25 VITALS — BP 132/64 | HR 75 | Temp 95.2°F | Ht 59.0 in | Wt 142.6 lb

## 2021-04-25 DIAGNOSIS — E119 Type 2 diabetes mellitus without complications: Secondary | ICD-10-CM

## 2021-04-25 DIAGNOSIS — G8929 Other chronic pain: Secondary | ICD-10-CM

## 2021-04-25 DIAGNOSIS — M542 Cervicalgia: Secondary | ICD-10-CM

## 2021-04-25 LAB — POCT GLYCOSYLATED HEMOGLOBIN (HGB A1C): Hemoglobin A1C: 7.3 % — AB (ref 4.0–5.6)

## 2021-04-25 MED ORDER — CYCLOBENZAPRINE HCL 10 MG PO TABS: 10.0000 mg | ORAL_TABLET | Freq: Two times a day (BID) | ORAL | 0 refills | Status: AC | PRN

## 2021-04-25 MED ORDER — NAPROXEN 500 MG PO TABS: 500.0000 mg | ORAL_TABLET | Freq: Two times a day (BID) | ORAL | 0 refills | Status: AC

## 2021-04-25 NOTE — Progress Notes (Signed)
Subjective:     Kathryn Guerra is a 55 y.o. female who presents for follow up of diabetes.. Current symptoms include: none. Patient denies foot ulcerations, polydipsia, polyuria, visual disturbances, and vomiting. Evaluation to date has been: fasting blood sugar, fasting lipid panel, and hemoglobin A1C. Home sugars: patient does not check sugars. Current treatments: more intensive attention to diet which has been effective and increased aerobic exercise which has been effective. Last dilated eye exam unknown.  Patient is requesting refills on naproxen and flexeril for neck - patient states that this helped at last visit    Review of Systems Review of Systems  Constitutional: Negative.   HENT: Negative.    Eyes: Negative.   Respiratory: Negative.    Cardiovascular: Negative.   Gastrointestinal: Negative.   Genitourinary: Negative.   Musculoskeletal: Negative.   Skin: Negative.   Neurological: Negative.   Endo/Heme/Allergies: Negative.   Psychiatric/Behavioral: Negative.       Objective:       Physical Exam Constitutional:      General: She is not in acute distress. Cardiovascular:     Rate and Rhythm: Normal rate and regular rhythm.  Pulmonary:     Effort: Pulmonary effort is normal.     Breath sounds: Normal breath sounds.  Skin:    General: Skin is warm and dry.  Neurological:     Mental Status: She is alert and oriented to person, place, and time.  Psychiatric:        Mood and Affect: Affect normal.     Laboratory: No components found for: A1C    Assessment:    Diabetes mellitus Type II, under good control.    Plan:    Educational material distributed. Addressed ADA diet. Suggested low cholesterol diet. Encouraged aerobic exercise. Reminded to get yearly retinal exam. Follow up in 3 months or as needed.

## 2021-04-25 NOTE — Progress Notes (Signed)
States body aches have improved not having much pain anymore

## 2021-04-25 NOTE — Patient Instructions (Addendum)
Diabetes:  HGB A1C much improved - previously over 9  Lab Results  Component Value Date   HGBA1C 7.3 (A) 04/25/2021     Continue healthy diet  Continue exercise routine  Continue current medications  Follow up:  Follow up in 3 months or sooner if needed  Diabetes mellitus y nutricin, en adultos Diabetes Mellitus and Nutrition, Adult Si sufre de diabetes, o diabetes mellitus, es muy importante tener hbitos alimenticios saludables debido a que sus niveles de Psychologist, counselling sangre (glucosa) se ven afectados en gran medida por lo que come y bebe. Comer alimentos saludables en las cantidades correctas, aproximadamente a la misma hora todos los [place], [place] ayudar a: Scientist, physiological glucemia. Disminuir el riesgo de sufrir una enfermedad cardaca. Mejorar la presin arterial. Barista o mantener un peso saludable. Qu puede afectar mi plan de alimentacin? Todas las personas que sufren de diabetes son diferentes y cada una tiene necesidades diferentes en cuanto a un plan de alimentacin. El mdico puede recomendarle que trabaje con un nutricionista para elaborar el mejor plan para usted. Su plan de alimentacin puede variar segn factores como: Las caloras que necesita. Los medicamentos que toma. Su peso. Sus niveles de glucemia, presin arterial y colesterol. Su nivel de [place]. Otras afecciones que tenga, como enfermedades cardacas o renales. Cmo me afectan los carbohidratos? Los carbohidratos, o hidratos de carbono, afectan su nivel de glucemia ms que cualquier otro tipo de alimento. La ingesta de carbohidratos naturalmente aumenta la cantidad de [employer]. El recuento de carbohidratos es un mtodo destinado a Midwife un registro de la cantidad de carbohidratos que se consumen. El recuento de carbohidratos es importante para Pharmacologist la glucemia a un nivel saludable, especialmente si utiliza insulina o toma determinadosmedicamentos por va oral para la diabetes. Es  importante conocer la cantidad de carbohidratos que se pueden ingerir en cada comida sin correr Surveyor, minerals. Esto es Government social research officer. Su nutricionista puede ayudarlo a calcular la cantidad de carbohidratos que debeingerir en cada comida y en cada refrigerio. Cmo me afecta el alcohol? El alcohol puede provocar disminuciones sbitas de la glucemia (hipoglucemia), especialmente si utiliza insulina o toma determinados medicamentos por va oral para la diabetes. La hipoglucemia es una afeccin potencialmente mortal. Los sntomas de la hipoglucemia, como somnolencia, mareos y confusin, son similares a los sntomas de haber consumido demasiado alcohol. No beba alcohol si: Su mdico le indica no hacerlo. Est embarazada, puede estar embarazada o est tratando de [place]. Si bebe alcohol: No beba con el estmago vaco. Limite la cantidad que bebe: De 0 a 1 medida por da para las mujeres. De 0 a 2 medidas por da para los hombres. Est atento a la cantidad de alcohol que hay en las bebidas que toma. En los [address], una medida equivale a una botella de cerveza de 12 oz (355 ml), un vaso de vino de 5 oz (148 ml) o un vaso de una bebida alcohlica de alta graduacin de 1 oz (44 ml). Mantngase hidratado bebiendo agua, refrescos dietticos o t helado sin azcar. Tenga en cuenta que los refrescos comunes, los jugos y otras bebida para Engineer, manufacturing pueden contener mucha azcar y se deben contar como carbohidratos. Consejos para seguir Social worker las etiquetas de los alimentos Comience por leer el tamao de la porcin en la "Informacin nutricional" en las etiquetas de los alimentos envasados y las bebidas. La cantidad de caloras, carbohidratos, grasas y otros nutrientes mencionados en la etiqueta se  basan en una porcin del alimento. Muchos alimentos contienen ms de una porcin por envase. Verifique la cantidad total de gramos (g) de carbohidratos totales en una porcin. Puede  calcular la cantidad de porciones de carbohidratos al dividir el total de carbohidratos por 15. Por ejemplo, si un alimento tiene un total de 30 g de carbohidratos totales por porcin, equivale a 2 porciones de carbohidratos. Verifique la cantidad de gramos (g) de grasas saturadas y grasas trans de una porcin. Escoja alimentos que no contengan estas grasas o que su contenido de estas sea [place]. Verifique la cantidad de miligramos (mg) de sal (sodio) en una porcin. La [employer] de las personas deben limitar la ingesta de sodio total a menos de 2300 mg Google. Siempre consulte la informacin nutricional de los alimentos etiquetados como "con bajo contenido de grasa" o "sin grasa". Estos alimentos pueden tener un mayor contenido de International aid/development worker agregada o carbohidratos refinados, y deben evitarse. Hable con su nutricionista para identificar sus objetivos diarios en cuanto a los nutrientes mencionados en la etiqueta. Al ir de compras Evite comprar alimentos procesados, enlatados o precocidos. Estos alimentos tienden a Counselling psychologist mayor cantidad de [place], sodio y azcar agregada. Compre en la zona exterior de la tienda de comestibles. Esta es la zona donde se encuentran con mayor frecuencia las frutas y las verduras frescas, los cereales a granel, las carnes frescas y los productos lcteos frescos. Al cocinar Utilice mtodos de coccin a baja temperatura, como hornear, en lugar de mtodos de coccin a alta temperatura, como frer en abundante aceite. Cocine con aceites saludables, como el aceite de [place], canola o [place]. Evite cocinar con manteca, crema o carnes con alto contenido de grasa. Planificacin de las comidas Coma las comidas y los refrigerios regularmente, preferentemente a la misma hora todos [place]. Evite pasar largos perodos de tiempo sin comer. Consuma alimentos ricos en fibra, como frutas frescas, verduras, frijoles y cereales integrales. Consulte a su nutricionista sobre cuntas porciones de  carbohidratos puede consumir en cada comida. Consuma entre 4 y 6 onzas (entre 112 y 168 g) de protenas magras por da, como carnes Rolfe, pollo, pescado, huevos o tofu. Una onza (oz) de protena magra equivale a: 1 onza (28 g) de carne, pollo o pescado. 1 huevo.  de taza (62 g) de tofu. Coma algunos alimentos por da que contengan grasas saludables, como aguacates, frutos secos, semillas y pescado. Qu alimentos debo comer? Nils Pyle Bayas. Manzanas. Naranjas. Duraznos. Damascos. Ciruelas. Uvas. Mango. Papaya.Granada. Kiwi. Cerezas. Hoover Brunette Deatra James. Espinaca. Verduras de [employer], que incluyen col rizada, [place], hojas de [place] y de [place]. Remolachas. Coliflor. Repollo. Brcoli. Zanahorias. Judas verdes. Tomates. Pimientos. Cebollas. Pepinos. Coles deBruselas. Granos Granos integrales, como panes, galletas, tortillas, cereales y pastas desalvado o integrales. Avena sin azcar. Quinua. Arroz integral o salvaje. Carnes y Warehouse manager. Carne de ave sin piel. Cortes magros de ave y carne de res. Tofu.Frutos secos. Semillas. Lcteos Productos lcteos sin grasa o con bajo contenido de [place], [place], yogur [place]. Es posible que los productos que se enumeran ms [place] no constituyan una lista completa de los alimentos y las bebidas que puede tomar. Consulte a un nutricionista para obtener ms informacin. Qu alimentos debo evitar? Nils Pyle Frutas enlatadas al almbar. Verduras Verduras enlatadas. Verduras congeladas con mantequilla o salsa de crema. Granos Productos elaborados con [place] y [place], como panes, pastas,bocadillos y cereales. Evite todos los alimentos procesados. Carnes y [address] de carne con alto contenido de Holiday representative.  Carne de ave con piel. Carnesempanizadas o fritas. Carne procesada. Evite las grasas saturadas. Lcteos Yogur, queso o [employer] Health. Bebidas Bebidas azucaradas, como gaseosas o t helado. Es posible que los  productos que se enumeran ms [place] no constituyan una lista completa de los alimentos y las bebidas que Personnel officer. Consulte a un nutricionista para obtener ms informacin. Preguntas para hacerle al mdico Es necesario que me rena con IT trainer en el cuidado de la diabetes? Es necesario que me rena con un nutricionista? A qu nmero puedo llamar si tengo preguntas? Cules son los mejores momentos para controlar la glucemia? Dnde encontrar ms informacin: Asociacin Estadounidense de la Diabetes (American Diabetes Association): diabetes.org Academy of Nutrition and Dietetics (Academia de Nutricin y Pension scheme manager): www.eatright.Dana Corporation of Diabetes and Digestive and Kidney Diseases [employer] de la Diabetes y las Enfermedades Digestivas y Renales): CarFlippers.tn Association of Diabetes Care and Education Specialists (Asociacin de Especialistas en Atencin y Educacin sobre la Diabetes): www.diabeteseducator.org Resumen Es importante tener hbitos alimenticios saludables debido a que sus niveles de Psychologist, counselling sangre (glucosa) se ven afectados en gran medida por lo que come y bebe. Un plan de alimentacin saludable lo ayudar a controlar la glucemia y Pharmacologist un estilo de vida saludable. El mdico puede recomendarle que trabaje con un nutricionista para elaborar el mejor plan para usted. Tenga en cuenta que los carbohidratos (hidratos de carbono) y el alcohol tienen efectos inmediatos en sus niveles de glucemia. Es importante contar los carbohidratos que ingiere y consumir alcohol con prudencia. Esta informacin no tiene Theme park manager el consejo del mdico. Asegresede hacerle al mdico cualquier pregunta que tenga. Document Revised: 10/07/2019 Document Reviewed: 10/07/2019 Elsevier Patient Education  2021 [employer].

## 2021-06-19 ENCOUNTER — Other Ambulatory Visit (INDEPENDENT_AMBULATORY_CARE_PROVIDER_SITE_OTHER): Payer: Self-pay | Admitting: Nurse Practitioner

## 2021-06-19 DIAGNOSIS — M542 Cervicalgia: Secondary | ICD-10-CM

## 2021-06-19 DIAGNOSIS — G8929 Other chronic pain: Secondary | ICD-10-CM

## 2021-07-26 ENCOUNTER — Ambulatory Visit (INDEPENDENT_AMBULATORY_CARE_PROVIDER_SITE_OTHER): Payer: Self-pay | Admitting: Primary Care

## 2022-03-16 ENCOUNTER — Other Ambulatory Visit (INDEPENDENT_AMBULATORY_CARE_PROVIDER_SITE_OTHER): Payer: Self-pay | Admitting: Primary Care

## 2022-03-16 DIAGNOSIS — E119 Type 2 diabetes mellitus without complications: Secondary | ICD-10-CM

## 2022-06-27 ENCOUNTER — Other Ambulatory Visit (INDEPENDENT_AMBULATORY_CARE_PROVIDER_SITE_OTHER): Payer: Self-pay | Admitting: Primary Care

## 2022-06-27 DIAGNOSIS — E119 Type 2 diabetes mellitus without complications: Secondary | ICD-10-CM

## 2022-07-23 IMAGING — DX DG CHEST 2V
2 series · 2 of 2 positions shown · non-contrast
Comparison: Radiograph 10/01/2019

CLINICAL DATA: Chest pain

EXAM:
CHEST - 2 VIEW

[chest pa]
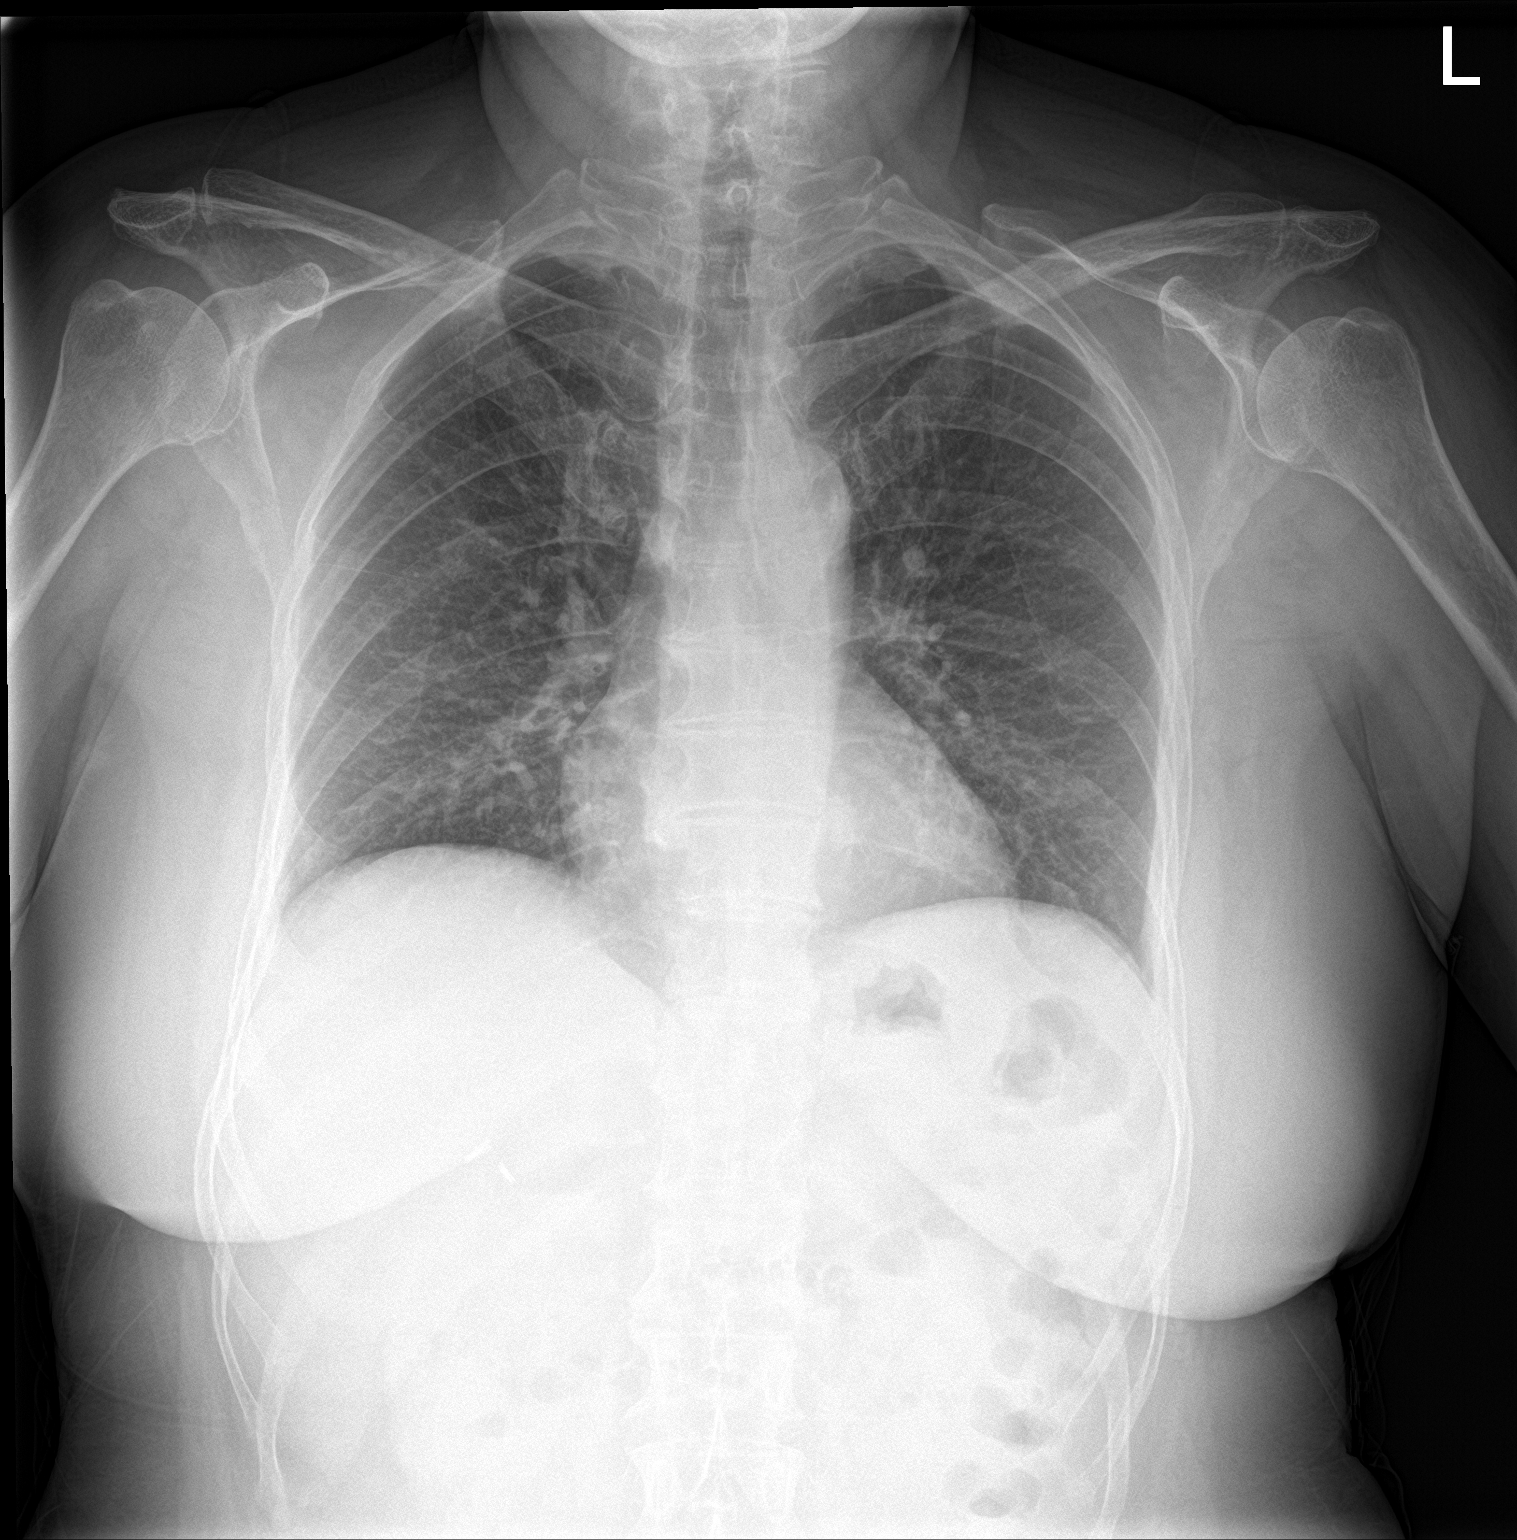

[chest lat]
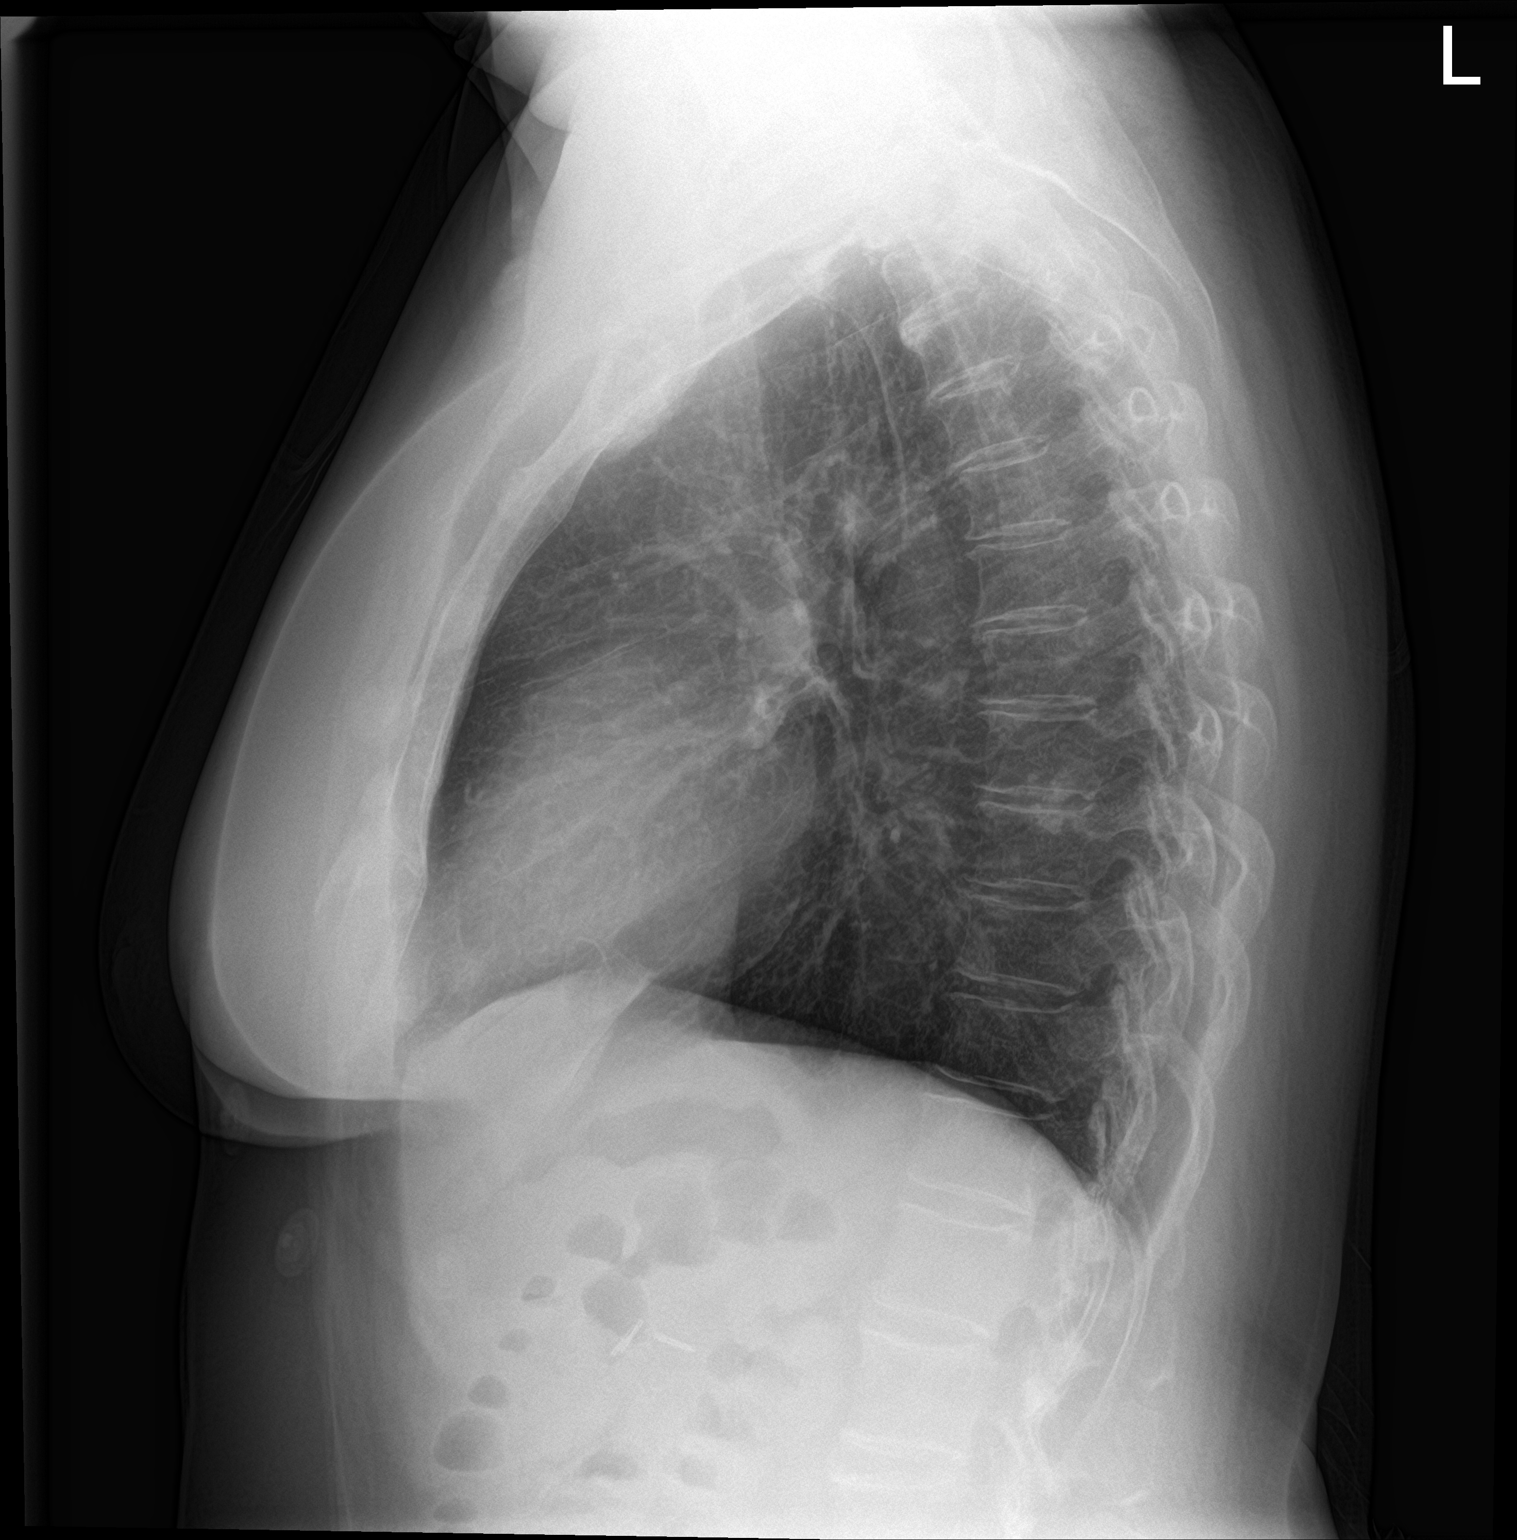

[2 of 2 positions shown; findings below may reference images not displayed]

FINDINGS: Slightly low lung volumes with some streaky opacities in the bases
favoring atelectatic change. No focal consolidation, pneumothorax or
effusion. The cardiomediastinal contours are unremarkable. No other
acute osseous or soft tissue abnormality. Cholecystectomy clips in
the right upper quadrant.
IMPRESSION: Slightly low lung volumes with streaky opacities in the bases
favoring atelectatic change. No other acute cardiopulmonary
abnormality.

## 2024-01-21 ENCOUNTER — Ambulatory Visit: Payer: Self-pay | Admitting: Nurse Practitioner
